# Patient Record
Sex: Male | Born: 1975 | Race: White | Marital: Married | Smoking: Never smoker
Health system: Southern US, Community
[De-identification: ages and names within clinical notes are randomized; demographics above are authoritative.]

---

## 2016-08-17 DIAGNOSIS — L821 Other seborrheic keratosis: Secondary | ICD-10-CM | POA: Diagnosis not present

## 2016-08-17 DIAGNOSIS — D229 Melanocytic nevi, unspecified: Secondary | ICD-10-CM | POA: Diagnosis not present

## 2016-08-17 DIAGNOSIS — D1800 Hemangioma unspecified site: Secondary | ICD-10-CM | POA: Diagnosis not present

## 2017-04-07 DIAGNOSIS — S46212A Strain of muscle, fascia and tendon of other parts of biceps, left arm, initial encounter: Secondary | ICD-10-CM | POA: Diagnosis not present

## 2017-04-07 DIAGNOSIS — M7702 Medial epicondylitis, left elbow: Secondary | ICD-10-CM | POA: Diagnosis not present

## 2017-04-07 DIAGNOSIS — M7712 Lateral epicondylitis, left elbow: Secondary | ICD-10-CM | POA: Diagnosis not present

## 2018-06-01 DIAGNOSIS — F4323 Adjustment disorder with mixed anxiety and depressed mood: Secondary | ICD-10-CM | POA: Diagnosis not present

## 2018-06-26 ENCOUNTER — Other Ambulatory Visit: Payer: Self-pay | Admitting: Family Medicine

## 2018-06-26 ENCOUNTER — Ambulatory Visit
Admission: RE | Admit: 2018-06-26 | Discharge: 2018-06-26 | Disposition: A | Discharge status: Home / Self Care | Payer: BLUE CROSS/BLUE SHIELD | Source: Ambulatory Visit | Attending: Family Medicine | Admitting: Family Medicine

## 2018-06-26 DIAGNOSIS — M7989 Other specified soft tissue disorders: Secondary | ICD-10-CM | POA: Diagnosis not present

## 2018-06-26 DIAGNOSIS — M79642 Pain in left hand: Secondary | ICD-10-CM

## 2018-06-26 DIAGNOSIS — R609 Edema, unspecified: Secondary | ICD-10-CM

## 2018-06-26 DIAGNOSIS — S6992XA Unspecified injury of left wrist, hand and finger(s), initial encounter: Secondary | ICD-10-CM | POA: Diagnosis not present

## 2018-07-03 DIAGNOSIS — J069 Acute upper respiratory infection, unspecified: Secondary | ICD-10-CM | POA: Diagnosis not present

## 2018-07-18 DIAGNOSIS — F4323 Adjustment disorder with mixed anxiety and depressed mood: Secondary | ICD-10-CM | POA: Diagnosis not present

## 2018-08-17 DIAGNOSIS — Z719 Counseling, unspecified: Secondary | ICD-10-CM | POA: Diagnosis not present

## 2018-08-17 DIAGNOSIS — Z733 Stress, not elsewhere classified: Secondary | ICD-10-CM | POA: Diagnosis not present

## 2018-08-17 DIAGNOSIS — F411 Generalized anxiety disorder: Secondary | ICD-10-CM | POA: Diagnosis not present

## 2018-11-22 DIAGNOSIS — F411 Generalized anxiety disorder: Secondary | ICD-10-CM | POA: Diagnosis not present

## 2018-11-22 DIAGNOSIS — E782 Mixed hyperlipidemia: Secondary | ICD-10-CM | POA: Diagnosis not present

## 2018-11-22 DIAGNOSIS — G4700 Insomnia, unspecified: Secondary | ICD-10-CM | POA: Diagnosis not present

## 2018-11-22 DIAGNOSIS — R030 Elevated blood-pressure reading, without diagnosis of hypertension: Secondary | ICD-10-CM | POA: Diagnosis not present

## 2019-02-08 DIAGNOSIS — Z23 Encounter for immunization: Secondary | ICD-10-CM | POA: Diagnosis not present

## 2019-08-08 ENCOUNTER — Encounter: Payer: Self-pay | Admitting: Family Medicine

## 2019-08-08 ENCOUNTER — Other Ambulatory Visit: Payer: Self-pay

## 2019-08-08 ENCOUNTER — Ambulatory Visit: Payer: Self-pay

## 2019-08-08 ENCOUNTER — Ambulatory Visit: Payer: BC Managed Care – PPO | Admitting: Family Medicine

## 2019-08-08 VITALS — BP 148/98 | Ht 66.0 in | Wt 202.0 lb

## 2019-08-08 DIAGNOSIS — M25511 Pain in right shoulder: Secondary | ICD-10-CM

## 2019-08-08 MED ORDER — METHYLPREDNISOLONE ACETATE 40 MG/ML IJ SUSP
40.0000 mg | Freq: Once | INTRAMUSCULAR | Status: AC
  Administered 2019-08-08: 11:00:00 40 mg via INTRA_ARTICULAR

## 2019-08-08 NOTE — Progress Notes (Signed)
PCP: Ozella Rocks, MD  Subjective:   HPI: Patient is a 44 y.o. male here for right shoulder pain.  Patient reports prior history of right shoulder problems including about 10 years ago when he saw a physician up in [place], had some imaging, and was told his shoulder looked like he'd had multiple surgeries. Last couple weeks his right shoulder has felt 'off.' No acute injury or trauma. Recalls this weekend he cut down a tree and had been walking his dog but no incident to lead to his pain. Pain is in anterior shoulder where it bothers him regularly but also now in posterior shoulder medial to scapula. Maybe some swelling but no bruising. Difficulty lifting arm actively. Not taking any medications. Has tried ice and heat. No radiation down arm. No numbness or tingling.  History reviewed. No pertinent past medical history.  No current outpatient medications on file prior to visit.   No current facility-administered medications on file prior to visit.    History reviewed. No pertinent surgical history.  No Known Allergies  Social History   Socioeconomic History  . Marital status: Unknown    Spouse name: Not on file  . Number of children: Not on file  . Years of education: Not on file  . Highest education level: Not on file  Occupational History  . Not on file  Tobacco Use  . Smoking status: Not on file  Substance and Sexual Activity  . Alcohol use: Not on file  . Drug use: Not on file  . Sexual activity: Not on file  Other Topics Concern  . Not on file  Social History Narrative  . Not on file   Social Determinants of Health   Financial Resource Strain:   . Difficulty of Paying Living Expenses:   Food Insecurity:   . Worried About Programme researcher, broadcasting/film/video in the Last Year:   . Barista in the Last Year:   Transportation Needs:   . Freight forwarder (Medical):   Marland Kitchen Lack of Transportation (Non-Medical):   Physical Activity:   . Days of Exercise per  Week:   . Minutes of Exercise per Session:   Stress:   . Feeling of Stress :   Social Connections:   . Frequency of Communication with Friends and Family:   . Frequency of Social Gatherings with Friends and Family:   . Attends Religious Services:   . Active Member of Clubs or Organizations:   . Attends Banker Meetings:   Marland Kitchen Marital Status:   Intimate Partner Violence:   . Fear of Current or Ex-Partner:   . Emotionally Abused:   Marland Kitchen Physically Abused:   . Sexually Abused:     History reviewed. No pertinent family history.  BP (!) 148/98   Ht 5\' 6"  (1.676 m)   Wt 202 lb (91.6 kg)   BMI 32.60 kg/m   Review of Systems: See HPI above.     Objective:  Physical Exam:  Gen: NAD, comfortable in exam room  Right shoulder: No swelling, ecchymoses.  No gross deformity. No TTP AC joint.  Mild TTP anterior shoulder.  No posterior tenderness. Full passive ROM.  Actively has full IR, ER to 20 degrees, abduction and flexion only to 30 degrees. Positive Hawkins, Neers. Negative Yergasons. Strength 5/5 with resisted internal/external rotation.  Unable to position for empty can. NV intact distally.  Left shoulder: No swelling, ecchymoses.  No gross deformity. No TTP. FROM. Strength 5/5 with empty  can and resisted internal/external rotation. NV intact distally.  MSK u/s right shoulder: Biceps tendon intact on long and trans views without tenosynovitis Subscapularis without apparent tears though small amount of calcific tendinopathy distal tendon. Pec major tendon intact without abnormalities AC joint with mild effusion, no arthropathy. Posterior glenohumeral joint without effusion.  No abnormalities of visualized labrum. Infraspinatus intact without tears Supraspinatus with mod-severe calcific tendinopathy with mild subacromial bursitis.  No evidence full thickness or visible partial thickness tears.   Assessment & Plan:  1. Right shoulder pain - 2/2 mod-severe  calcific rotator cuff tendinopathy.  Subacromial injection given today.  Codman exercises reviewed.  Aleve or ibuprofen.  F/u in 1 month but call us sooner with an update on his status.    After informed written consent timeout was performed, patient was seated on exam table. Right shoulder was prepped with alcohol swab and utilizing lateral approach with ultrasound guidance, patient's right subacromial space was injected with 3:1 bupivicaine: depomedrol. Patient tolerated the procedure well without immediate complications.

## 2019-08-08 NOTE — Patient Instructions (Signed)
You have a calcific rotator cuff tendinopathy. Try to avoid painful activities (overhead activities, lifting with extended arm) as much as possible. Aleve 2 tabs twice a day with food OR ibuprofen 3 tabs three times a day with food for pain and inflammation as needed. Can take tylenol in addition to this. Subacromial injection may be beneficial to help with pain and to decrease inflammation - you were given this today. Consider physical therapy with transition to home exercise program in the future. Motion exercises as we discussed - arm circles, swings, table slides Follow up with me in 1 month but call me in a week to let me know how you're doing.

## 2019-09-10 ENCOUNTER — Encounter: Payer: Self-pay | Admitting: Family Medicine

## 2019-09-10 ENCOUNTER — Ambulatory Visit (INDEPENDENT_AMBULATORY_CARE_PROVIDER_SITE_OTHER): Payer: BC Managed Care – PPO | Admitting: Family Medicine

## 2019-09-10 ENCOUNTER — Other Ambulatory Visit: Payer: Self-pay

## 2019-09-10 VITALS — BP 132/70 | Ht 66.0 in | Wt 210.0 lb

## 2019-09-10 DIAGNOSIS — M25511 Pain in right shoulder: Secondary | ICD-10-CM | POA: Diagnosis not present

## 2019-09-10 NOTE — Patient Instructions (Signed)
You're doing great! Do the home exercises as directed for the next 6 weeks (at least 5 days a week). Follow up with me as needed.

## 2019-09-10 NOTE — Progress Notes (Signed)
PCP: Ozella Rocks, MD  Subjective:   HPI: Patient is a 44 y.o. male here for right shoulder pain.  3/17: Patient reports prior history of right shoulder problems including about 10 years ago when he saw a physician up in [place], had some imaging, and was told his shoulder looked like he'd had multiple surgeries. Last couple weeks his right shoulder has felt 'off.' No acute injury or trauma. Recalls this weekend he cut down a tree and had been walking his dog but no incident to lead to his pain. Pain is in anterior shoulder where it bothers him regularly but also now in posterior shoulder medial to scapula. Maybe some swelling but no bruising. Difficulty lifting arm actively. Not taking any medications. Has tried ice and heat. No radiation down arm. No numbness or tingling.  4/19: Patient reports he feels much improved. Felt back to normal by that weekend. Still has some feeling that there's something in shoulder but no pain. Doing home exercises. No other complaints.  History reviewed. No pertinent past medical history.  No current outpatient medications on file prior to visit.   No current facility-administered medications on file prior to visit.    History reviewed. No pertinent surgical history.  No Known Allergies  Social History   Socioeconomic History  . Marital status: Unknown    Spouse name: Not on file  . Number of children: Not on file  . Years of education: Not on file  . Highest education level: Not on file  Occupational History  . Not on file  Tobacco Use  . Smoking status: Not on file  Substance and Sexual Activity  . Alcohol use: Not on file  . Drug use: Not on file  . Sexual activity: Not on file  Other Topics Concern  . Not on file  Social History Narrative  . Not on file   Social Determinants of Health   Financial Resource Strain:   . Difficulty of Paying Living Expenses:   Food Insecurity:   . Worried About Programme researcher, broadcasting/film/video  in the Last Year:   . Barista in the Last Year:   Transportation Needs:   . Freight forwarder (Medical):   Marland Kitchen Lack of Transportation (Non-Medical):   Physical Activity:   . Days of Exercise per Week:   . Minutes of Exercise per Session:   Stress:   . Feeling of Stress :   Social Connections:   . Frequency of Communication with Friends and Family:   . Frequency of Social Gatherings with Friends and Family:   . Attends Religious Services:   . Active Member of Clubs or Organizations:   . Attends Banker Meetings:   Marland Kitchen Marital Status:   Intimate Partner Violence:   . Fear of Current or Ex-Partner:   . Emotionally Abused:   Marland Kitchen Physically Abused:   . Sexually Abused:     History reviewed. No pertinent family history.  BP 132/70   Ht 5\' 6"  (1.676 m)   Wt 210 lb (95.3 kg)   BMI 33.89 kg/m   Review of Systems: See HPI above.     Objective:  Physical Exam:  Gen: NAD, comfortable in exam room  Right shoulder: No swelling, ecchymoses.  No gross deformity. No TTP. FROM. Negative Hawkins, Neers. Strength 5/5 with empty can and resisted internal/external rotation. Negative apprehension. NV intact distally.   Assessment & Plan:  1. Right shoulder pain - 2/2 mod-severe calcific rotator cuff tendinopathy.  Significant improvement following subacromial injection.  Start theraband strengthening, can stop Codman exercises.  Aleve or ibuprofen if needed.  F/u prn.

## 2020-04-03 DIAGNOSIS — J019 Acute sinusitis, unspecified: Secondary | ICD-10-CM | POA: Diagnosis not present

## 2020-07-22 IMAGING — CR DG HAND COMPLETE 3+V*L*
3 series · 3 of 3 positions shown · non-contrast
Comparison: None.

CLINICAL DATA: Jammed LEFT hand on door last PM / being pulled by
large dog on leash / MIEYRA and erythema across MCP jts / most pain
distal RING and lat little fingers / concern for FX / jdh [id number]
hand pain/ Swelling/ injury

EXAM:
LEFT HAND - COMPLETE 3+ VIEW

[x hand pa left]
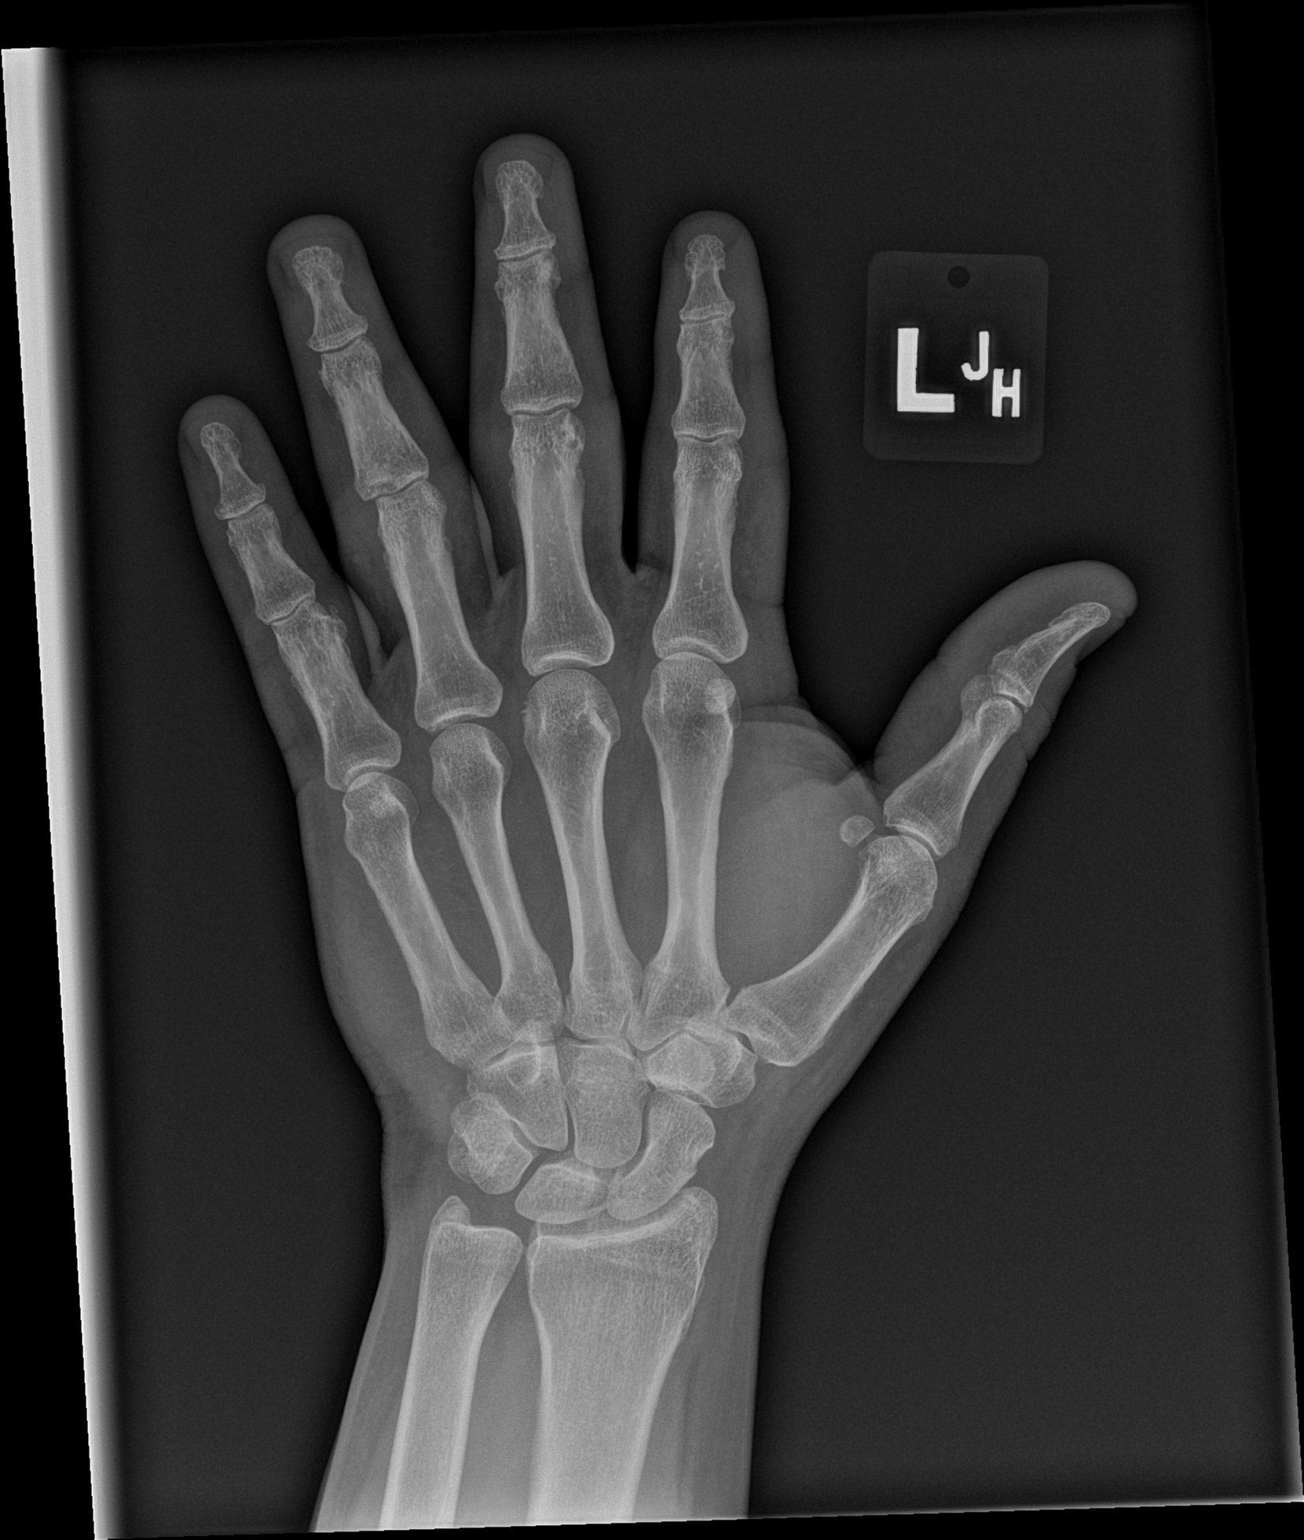

[x hand obl left]
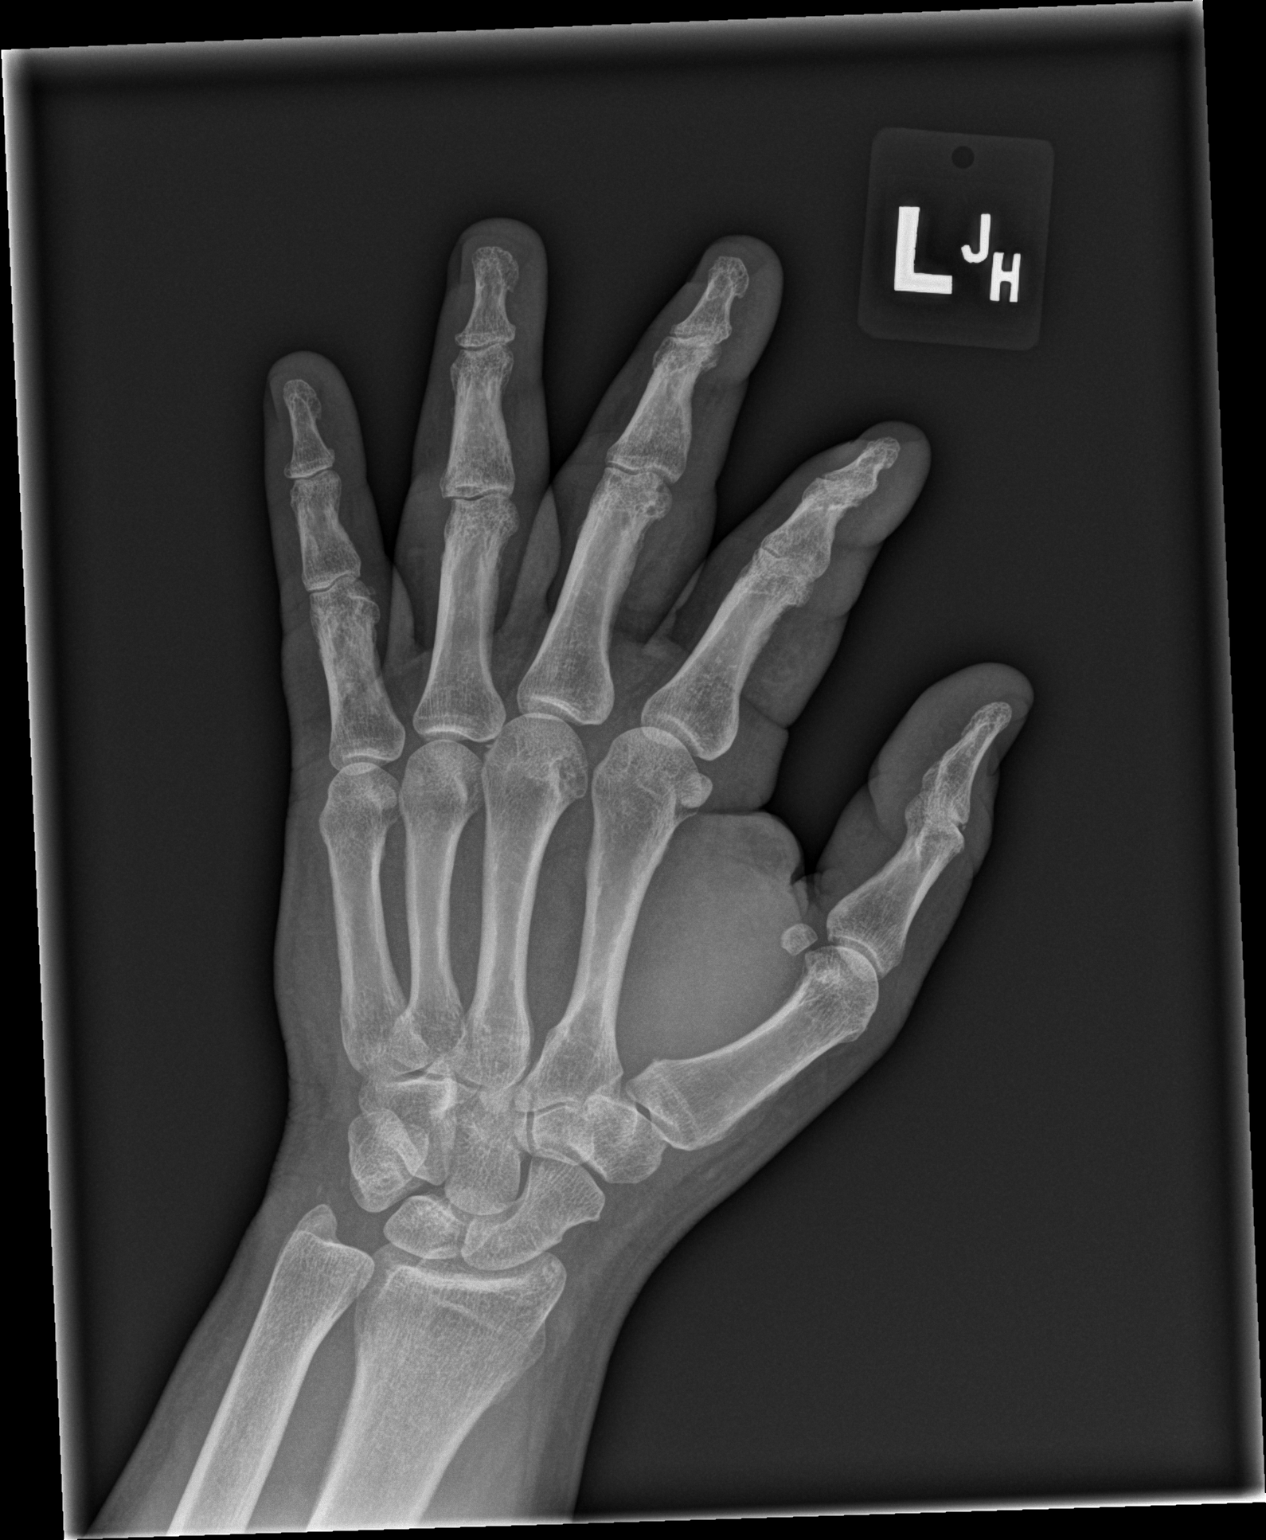

[x hand lat left]
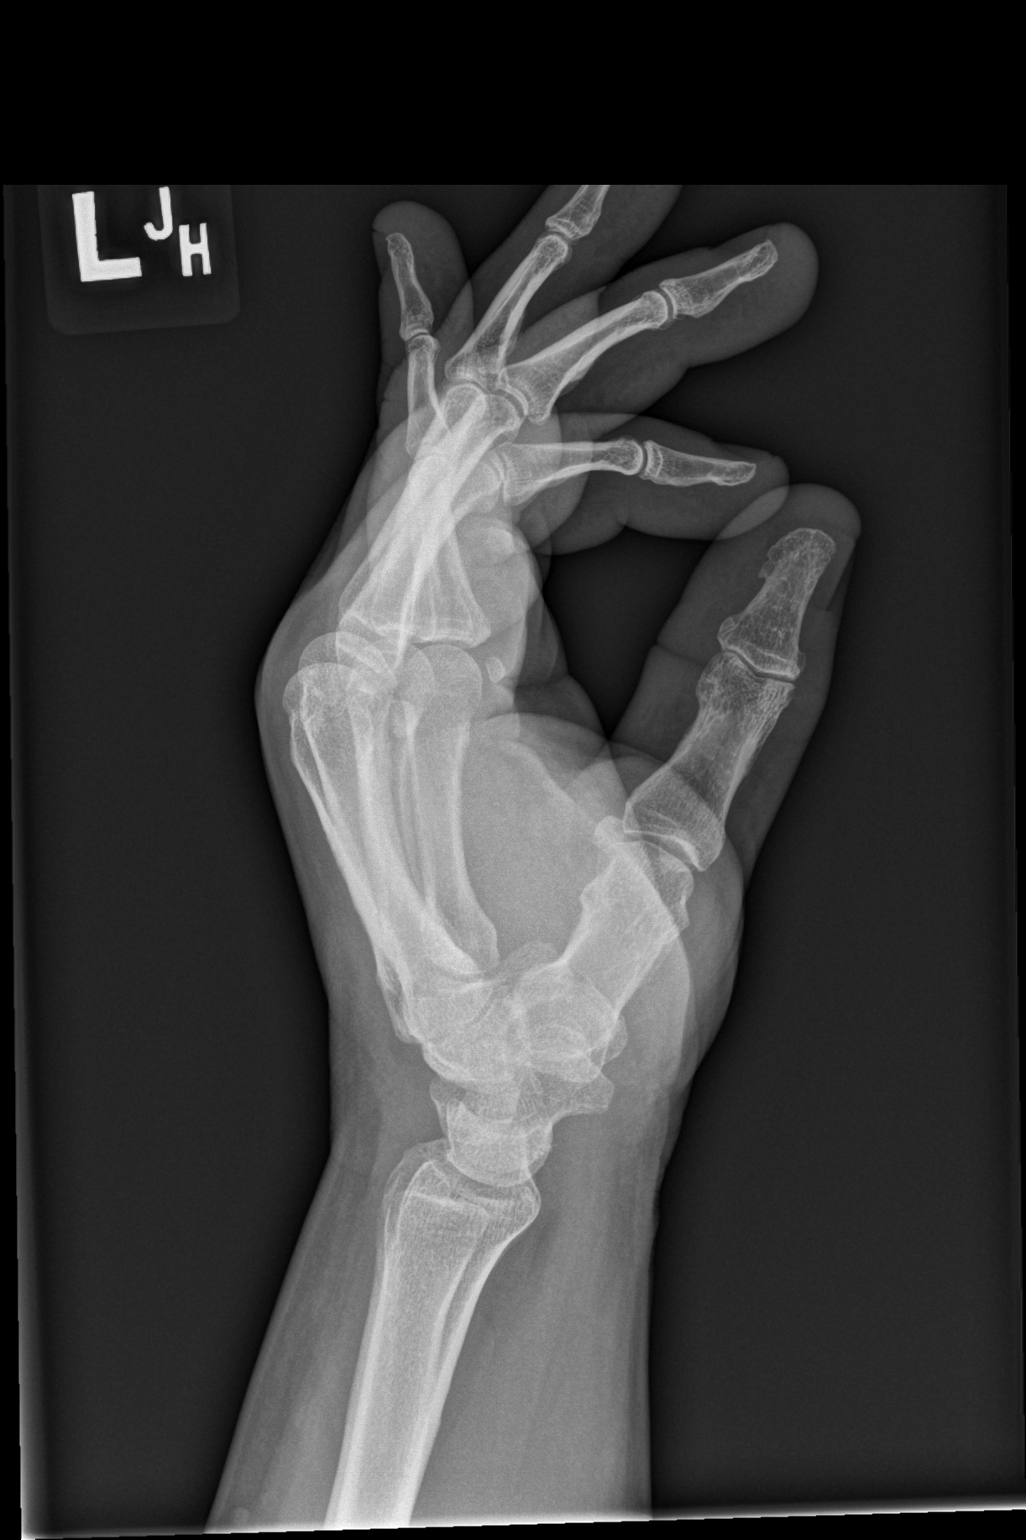

[3 of 3 positions shown; findings below may reference images not displayed]

FINDINGS: No evidence of fracture of the carpal or metacarpal bones.
Radiocarpal joint is intact. Phalanges are normal. There is
sclerosis in the proximal phalanx of the fifth digit which could
indicate remote fracture. No soft tissue injury.
IMPRESSION: No fracture or dislocation.

Sclerosis of the proximal phalanx fifth digit favored remote
trauma..

## 2022-01-29 ENCOUNTER — Encounter (HOSPITAL_BASED_OUTPATIENT_CLINIC_OR_DEPARTMENT_OTHER): Payer: Self-pay | Admitting: Family Medicine

## 2022-01-29 ENCOUNTER — Ambulatory Visit (HOSPITAL_BASED_OUTPATIENT_CLINIC_OR_DEPARTMENT_OTHER): Payer: 59 | Admitting: Family Medicine

## 2022-01-29 DIAGNOSIS — E785 Hyperlipidemia, unspecified: Secondary | ICD-10-CM

## 2022-01-29 DIAGNOSIS — Z7689 Persons encountering health services in other specified circumstances: Secondary | ICD-10-CM | POA: Insufficient documentation

## 2022-01-29 NOTE — Assessment & Plan Note (Signed)
Noted on history, no recent medications. Will check labs through work before next appt for CPE

## 2022-01-29 NOTE — Assessment & Plan Note (Signed)
No acute concerns today, reviewed general recommendations related to MSK concerns, recommendations regarding exercise regimen, types of exercises to incorporate into weekly schedule. We will arrange for CPE to be completed in the next couple months.  He will arrange to have labs completed through his work and then contact us to schedule CPE accordingly

## 2022-01-29 NOTE — Progress Notes (Signed)
New Patient Office Visit  Subjective    Patient ID: John Dominguez, male    DOB: 1976-03-13  Age: 46 y.o. MRN: [id number]  CC:  Chief Complaint  Patient presents with   New Patient (Initial Visit)    Pt here to establish new care     HPI John Dominguez presents to establish care Last PCP - was seeing provider through his work  Reports history of intermittent issues with sciatica in the past. No current concerns related to this. Does have some general questions regarding MSK issues, general health and wellbeing  HLD: Was taking atorvastatin at one point in the past, has stopped the medication, did not have any side effects. Has been working on lifestyle modifications.  Does have some family history of HTN, father had DM2 Oftentimes he will get baseline labs through work, last time was about 12-18 months ago.  Patient is originally from [place]. He has been living here for about 10 years. He currently works for [employer], works in Geophysicist/field seismologist related to pesticides. Outside of work, he enjoys spending time with family, works out at [employer], walking dogs.  No outpatient encounter medications on file as of 01/29/2022.   No facility-administered encounter medications on file as of 01/29/2022.    History reviewed. No pertinent past medical history.  History reviewed. No pertinent surgical history.  History reviewed. No pertinent family history.  Social History   Socioeconomic History   Marital status: Unknown    Spouse name: Not on file   Number of children: Not on file   Years of education: Not on file   Highest education level: Not on file  Occupational History   Not on file  Tobacco Use   Smoking status: Not on file   Smokeless tobacco: Not on file  Substance and Sexual Activity   Alcohol use: Not on file   Drug use: Not on file   Sexual activity: Not on file  Other Topics Concern   Not on file  Social History Narrative   Not on file    Social Determinants of Health   Financial Resource Strain: Not on file  Food Insecurity: Not on file  Transportation Needs: Not on file  Physical Activity: Not on file  Stress: Not on file  Social Connections: Not on file  Intimate Partner Violence: Not on file    Objective    BP (!) 151/99   Pulse 81   Temp 97.7 F (36.5 C) (Oral)   Ht 5\' 6"  (1.676 m)   Wt 213 lb (96.6 kg)   SpO2 99%   BMI 34.38 kg/m   Physical Exam  46 year old male in no acute distress Cardiovascular exam with regular rate and rhythm, no murmur appreciated Lungs clear to auscultation bilaterally  Assessment & Plan:   Problem List Items Addressed This Visit       Other   Encounter to establish care    No acute concerns today, reviewed general recommendations related to MSK concerns, recommendations regarding exercise regimen, types of exercises to incorporate into weekly schedule. We will arrange for CPE to be completed in the next couple months.  He will arrange to have labs completed through his work and then contact 55 to schedule CPE accordingly      Hyperlipidemia    Noted on history, no recent medications. Will check labs through work before next appt for CPE       Return in about 2 months (around 03/31/2022) for CPE.  Ashe Graybeal J [place], MD

## 2022-01-29 NOTE — Patient Instructions (Signed)
  Medication Instructions:  Your physician recommends that you continue on your current medications as directed. Please refer to the Current Medication list given to you today. --If you need a refill on any your medications before your next appointment, please call your pharmacy first. If no refills are authorized on file call the office.-- Lab Work: Your physician has recommended that you have lab work today: No If you have labs (blood work) drawn today and your tests are completely normal, you will receive your results via MyChart message OR a phone call from our staff.  Please ensure you check your voicemail in the event that you authorized detailed messages to be left on a delegated number. If you have any lab test that is abnormal or we need to change your treatment, we will call you to review the results.  Referrals/Procedures/Imaging: No  Follow-Up: Your next appointment:   Your physician recommends that you schedule a follow-up appointment in: 2 months cpe with Dr. de [place].  You will receive a text message or e-mail with a link to a survey about your care and experience with [place] today! We would greatly appreciate your feedback!   Thanks for letting us be apart of your health journey!!  Primary Care and Sports Medicine   Dr. Ceasar Mons [place]   We encourage you to activate your patient portal called "MyChart".  Sign up information is provided on this After Visit Summary.  MyChart is used to connect with patients for Virtual Visits (Telemedicine).  Patients are able to view lab/test results, encounter notes, upcoming appointments, etc.  Non-urgent messages can be sent to your provider as well. To learn more about what you can do with MyChart, please visit --  [email or web address].

## 2022-03-31 ENCOUNTER — Ambulatory Visit (INDEPENDENT_AMBULATORY_CARE_PROVIDER_SITE_OTHER): Payer: 59 | Admitting: Family Medicine

## 2022-03-31 ENCOUNTER — Encounter (HOSPITAL_BASED_OUTPATIENT_CLINIC_OR_DEPARTMENT_OTHER): Payer: Self-pay | Admitting: Family Medicine

## 2022-03-31 VITALS — BP 142/92 | HR 88 | Ht 66.0 in | Wt 217.7 lb

## 2022-03-31 DIAGNOSIS — E785 Hyperlipidemia, unspecified: Secondary | ICD-10-CM | POA: Diagnosis not present

## 2022-03-31 DIAGNOSIS — R030 Elevated blood-pressure reading, without diagnosis of hypertension: Secondary | ICD-10-CM | POA: Diagnosis not present

## 2022-03-31 NOTE — Patient Instructions (Signed)
  Medication Instructions:  Your physician recommends that you continue on your current medications as directed. Please refer to the Current Medication list given to you today. --If you need a refill on any your medications before your next appointment, please call your pharmacy first. If no refills are authorized on file call the office.-- Lab Work: Your physician has recommended that you have lab work today: Will call when we receive  If you have labs (blood work) drawn today and your tests are completely normal, you will receive your results via MyChart message OR a phone call from our staff.  Please ensure you check your voicemail in the event that you authorized detailed messages to be left on a delegated number. If you have any lab test that is abnormal or we need to change your treatment, we will call you to review the results.  Referrals/Procedures/Imaging: NONE  Follow-Up: Your next appointment:   Your physician recommends that you schedule a follow-up appointment in: 3-4 months with Dr. de [place]  You will receive a text message or e-mail with a link to a survey about your care and experience with [place] today! We would greatly appreciate your feedback!   Thanks for letting us be apart of your health journey!!  Primary Care and Sports Medicine   Dr. Ceasar Mons [place]   We encourage you to activate your patient portal called "MyChart".  Sign up information is provided on this After Visit Summary.  MyChart is used to connect with patients for Virtual Visits (Telemedicine).  Patients are able to view lab/test results, encounter notes, upcoming appointments, etc.  Non-urgent messages can be sent to your provider as well. To learn more about what you can do with MyChart, please visit --  [email or web address].

## 2022-03-31 NOTE — Progress Notes (Signed)
    Procedures performed today:    None.  Independent interpretation of notes and tests performed by another provider:   None.  Brief History, Exam, Impression, and Recommendations:    BP (!) 142/92   Pulse 88   Ht 5\' 6"  (1.676 m)   Wt 217 lb 11.2 oz (98.7 kg)   SpO2 97%   BMI 35.14 kg/m   Elevated blood pressure reading in office without diagnosis of hypertension Blood pressure continues to be borderline in office today, it is slightly improved compared to last office visit reading.  He has been working on lifestyle modifications, focusing on diet, exercising more.  Denies any current issues related to chest pain or headaches.  No vision changes. For now, we will continue with lifestyle modifications, focusing on dietary changes, regular aerobic exercise.  Recommend intermittent monitoring of blood pressure at home, DASH diet, handout provided today.  Hyperlipidemia Patient does get labs through his workplace.  Indicates that he has had elevated cholesterol on prior lab readings.  He has been working on changes to his diet, exercising more.  We reviewed lifestyle modification recommendations today, handout provided as well reviewing the same recommendations.  He will plan to have labs repeated through his work and we will continue with monitoring for now  Return in about 3 months (around 07/01/2022) for BP, HLD.   ___________________________________________ Marieann Zipp de 08/30/2022, MD, ABFM, [facility] Primary Care and Sports Medicine [facility]

## 2022-04-05 NOTE — Assessment & Plan Note (Signed)
Patient does get labs through his workplace.  Indicates that he has had elevated cholesterol on prior lab readings.  He has been working on changes to his diet, exercising more.  We reviewed lifestyle modification recommendations today, handout provided as well reviewing the same recommendations.  He will plan to have labs repeated through his work and we will continue with monitoring for now

## 2022-04-05 NOTE — Assessment & Plan Note (Signed)
Blood pressure continues to be borderline in office today, it is slightly improved compared to last office visit reading.  He has been working on lifestyle modifications, focusing on diet, exercising more.  Denies any current issues related to chest pain or headaches.  No vision changes. For now, we will continue with lifestyle modifications, focusing on dietary changes, regular aerobic exercise.  Recommend intermittent monitoring of blood pressure at home, DASH diet, handout provided today.

## 2022-06-14 ENCOUNTER — Ambulatory Visit (INDEPENDENT_AMBULATORY_CARE_PROVIDER_SITE_OTHER): Payer: 59 | Admitting: Family Medicine

## 2022-06-14 ENCOUNTER — Encounter (HOSPITAL_BASED_OUTPATIENT_CLINIC_OR_DEPARTMENT_OTHER): Payer: Self-pay | Admitting: Family Medicine

## 2022-06-14 VITALS — BP 124/92 | HR 103 | Ht 66.0 in | Wt 216.0 lb

## 2022-06-14 DIAGNOSIS — R234 Changes in skin texture: Secondary | ICD-10-CM | POA: Diagnosis not present

## 2022-06-14 NOTE — Patient Instructions (Signed)
Keep area clean and dry, protect area from further area.

## 2022-06-14 NOTE — Progress Notes (Signed)
   Acute Office Visit  Subjective:     Patient ID: John Dominguez, male    DOB: 1976-05-21, 47 y.o.   MRN: [id number]  Chief Complaint  Patient presents with   Foot Pain    Pt here for having a rash and pain on his left foot noticed it 25 days ago and using over the counter medications to help treat it    Rash    HPI Patient is in today for rash between 3rd and 4th toes on right foot. Reports his dog also scratched him there and he wonders if that is related. Has been using anti-fungal OTC medication that has not improved symptoms. Present for 3 weeks.  Denies fever or purulence, no redness.   Review of Systems  Constitutional:  Negative for chills and fever.  Skin:  Positive for itching and rash.       Right 3rd and 4th toes.         Objective:    BP (!) 124/92 (BP Location: Right Arm, Patient Position: Sitting, Cuff Size: Large)   Pulse (!) 103   Ht 5\' 6"  (1.676 m)   Wt 216 lb (98 kg)   SpO2 99%   BMI 34.86 kg/m    Physical Exam Vitals and nursing note reviewed.  Constitutional:      Appearance: Normal appearance. He is obese.  Cardiovascular:     Rate and Rhythm: Regular rhythm.  Pulmonary:     Effort: Pulmonary effort is normal.  Musculoskeletal:        General: Signs of injury (dog scatched area between 3rd and 4th toes.) present.     Right lower leg: No edema.     Left lower leg: No edema.  Feet:     Comments: Right DP pulse normal, no swelling. Has cut between 3rd and 4th toes possibly from dog nail injury. No signs of fungal infection. No purulence, no erythema, or warmth.  Neurological:     General: No focal deficit present.     Mental Status: He is alert. Mental status is at baseline.     No results found for any visits on 06/14/22.      Assessment & Plan:   Problem List Items Addressed This Visit     Fissure in skin - Primary    Skin broken between third and fourth toes, likely from dog claw injury.  Has been treating symptoms with  over-the-counter antifungals, without any improvement in symptoms. Does not appear to be fungal in nature.  There is no erythema, purulence, or warmth.  No indication for antibiotics.  He will keep area clean and dry, avoid further injury, and stop using antifungal treatment.     Agrees with plan of care discussed.  Questions answered.   No orders of the defined types were placed in this encounter.   Return if symptoms worsen or fail to improve.  Chalmers Guest, FNP

## 2022-06-14 NOTE — Assessment & Plan Note (Addendum)
Skin broken between third and fourth toes, likely from dog claw injury.  Has been treating symptoms with over-the-counter antifungals, without any improvement in symptoms. Does not appear to be fungal in nature.  There is no erythema, purulence, or warmth.  No indication for antibiotics.  He will keep area clean and dry, avoid further injury, and stop using antifungal treatment.

## 2022-08-04 ENCOUNTER — Ambulatory Visit (HOSPITAL_BASED_OUTPATIENT_CLINIC_OR_DEPARTMENT_OTHER): Payer: 59 | Admitting: Family Medicine

## 2022-08-04 ENCOUNTER — Encounter (HOSPITAL_BASED_OUTPATIENT_CLINIC_OR_DEPARTMENT_OTHER): Payer: Self-pay | Admitting: Family Medicine

## 2022-08-04 VITALS — BP 150/100 | HR 79 | Ht 66.0 in | Wt 216.0 lb

## 2022-08-04 DIAGNOSIS — R030 Elevated blood-pressure reading, without diagnosis of hypertension: Secondary | ICD-10-CM | POA: Diagnosis not present

## 2022-08-04 DIAGNOSIS — L84 Corns and callosities: Secondary | ICD-10-CM | POA: Diagnosis not present

## 2022-08-04 DIAGNOSIS — F419 Anxiety disorder, unspecified: Secondary | ICD-10-CM | POA: Diagnosis not present

## 2022-08-04 NOTE — Progress Notes (Signed)
    Procedures performed today:    None.  Independent interpretation of notes and tests performed by another provider:   None.  Brief History, Exam, Impression, and Recommendations:    BP (!) 150/100 (BP Location: Right Arm, Patient Position: Sitting, Cuff Size: Large)   Pulse 79   Ht  (1.676 m)   Wt 216 lb (98 kg)   SpO2 99%   BMI 34.86 kg/m   Callus between toes Initially seen in the office about 7 weeks ago for this issue.  Unfortunately, continues to have reported skin changes with callus, skin cracking between toes.  No significant pain, no reported discharge, no bleeding.  He has not had any redness.  Previously was using OTC antifungal cream but has stopped.  Feels that there has not been significant change in appearance of area On exam, patient is in no acute distress, vital signs stable, patient is afebrile.  Between toes, patient does have some skin thickening, cracks appear to be present as well with no current drainage.  No significant surrounding erythema or skin warmth. Does not appear to be acute infection at this time, however given persistence we will proceed with referral to podiatry for further evaluation and recommendations.  For now, would recommend keeping area clean and dry with regular foot checks to ensure no changes such as new bleeding or discharge or skin changes such as redness and warmth  Anxiety Patient Dors is symptoms of increased stress, worry this.  GAD-7 was completed in office today and score for this was 0.  We discussed issues and concerns that patient has at present as well as treatment considerations.  At this time, he would like to have referral placed to be able to arrange counseling/therapy, referral placed today.  We will continue to monitor progress with nonmedication option and determine need for any change in treatment  Elevated blood pressure reading in office without diagnosis of hypertension Blood pressure is elevated in office  today.  At most recent office visit, it was better controlled with normal systolic reading and slightly elevated diastolic reading.  He continues to work on lifestyle modifications.  No new symptoms such as chest pain, headaches, vision changes. Recommend intermittent monitoring of blood pressure at home, DASH diet.  We will continue to monitor closely to determine need to proceed with pharmacotherapy  Return in about 2 months (around 10/04/2022).   ___________________________________________ John Dominguez de [place], MD, ABFM, CAQSM Primary Care and [facility]

## 2022-08-12 ENCOUNTER — Ambulatory Visit: Payer: 59 | Admitting: Podiatry

## 2022-08-12 DIAGNOSIS — B359 Dermatophytosis, unspecified: Secondary | ICD-10-CM | POA: Diagnosis not present

## 2022-08-12 DIAGNOSIS — L84 Corns and callosities: Secondary | ICD-10-CM

## 2022-08-12 MED ORDER — CLINDAMYCIN PHOSPHATE 1 % EX GEL: Freq: Two times a day (BID) | CUTANEOUS | 0 refills | Status: AC

## 2022-08-12 MED ORDER — FLUCONAZOLE 150 MG PO TABS: 150.0000 mg | ORAL_TABLET | ORAL | 0 refills | Status: AC

## 2022-08-16 ENCOUNTER — Encounter: Payer: Self-pay | Admitting: Podiatry

## 2022-08-16 NOTE — Progress Notes (Signed)
  Subjective:  Patient ID: John Dominguez, male    DOB: 03-12-1976,  MRN: [id number]  Chief Complaint  Patient presents with   Tinea Pedis    NP Callus/ crack between toes - it's getting better, but it's been there a long time and he wants to get it healed up before summer.    47 y.o. male presents with the above complaint. History confirmed with patient.   Objective:  Physical Exam: warm, good capillary refill, no trophic changes or ulcerative lesions, normal DP and PT pulses, normal sensory exam, and interdigital maceration with heloma molle in third interspace lateral third toe  Assessment:   1. Heloma molle   2. Tinea      Plan:  Patient was evaluated and treated and all questions answered.  Discussed etiology of heloma molle and how it relates to tinea pedis and development of hyperkeratosis between the toes.  I discussed offloading with a spacer between the toes to allow airflow as well as use of clindamycin gel and Diflucan oral, Rx were both sent to pharmacy.  I will see him back as needed if it does not improve.  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  Return if symptoms worsen or fail to improve.

## 2022-09-14 NOTE — Assessment & Plan Note (Signed)
Initially seen in the office about 7 weeks ago for this issue.  Unfortunately, continues to have reported skin changes with callus, skin cracking between toes.  No significant pain, no reported discharge, no bleeding.  He has not had any redness.  Previously was using OTC antifungal cream but has stopped.  Feels that there has not been significant change in appearance of area On exam, patient is in no acute distress, vital signs stable, patient is afebrile.  Between toes, patient does have some skin thickening, cracks appear to be present as well with no current drainage.  No significant surrounding erythema or skin warmth. Does not appear to be acute infection at this time, however given persistence we will proceed with referral to podiatry for further evaluation and recommendations.  For now, would recommend keeping area clean and dry with regular foot checks to ensure no changes such as new bleeding or discharge or skin changes such as redness and warmth

## 2022-09-14 NOTE — Assessment & Plan Note (Signed)
Blood pressure is elevated in office today.  At most recent office visit, it was better controlled with normal systolic reading and slightly elevated diastolic reading.  He continues to work on lifestyle modifications.  No new symptoms such as chest pain, headaches, vision changes. Recommend intermittent monitoring of blood pressure at home, DASH diet.  We will continue to monitor closely to determine need to proceed with pharmacotherapy

## 2022-09-14 NOTE — Assessment & Plan Note (Signed)
Patient Dors is symptoms of increased stress, worry this.  GAD-7 was completed in office today and score for this was 0.  We discussed issues and concerns that patient has at present as well as treatment considerations.  At this time, he would like to have referral placed to be able to arrange counseling/therapy, referral placed today.  We will continue to monitor progress with nonmedication option and determine need for any change in treatment

## 2022-10-06 ENCOUNTER — Ambulatory Visit (HOSPITAL_BASED_OUTPATIENT_CLINIC_OR_DEPARTMENT_OTHER): Payer: 59 | Admitting: Family Medicine
